# Patient Record
Sex: Male | Born: 2000 | Race: White | Hispanic: No | Marital: Single
Health system: Southern US, Community
[De-identification: ages and names within clinical notes are randomized; demographics above are authoritative.]

## PROBLEM LIST (undated history)

## (undated) DIAGNOSIS — Z889 Allergy status to unspecified drugs, medicaments and biological substances status: Secondary | ICD-10-CM

---
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
## 2020-03-06 IMAGING — US US SCROTUM W/ DOPPLER COMPLETE
1 series · 14 of 25 positions shown · non-contrast
Comparison: None.

CLINICAL DATA: One week of intermittent testicular discomfort with
occasional sharp pains. Symptoms are greatest on the left. No report
of injury.

EXAM:
SCROTAL ULTRASOUND
DOPPLER ULTRASOUND OF THE TESTICLES
TECHNIQUE: Complete ultrasound examination of the testicles, epididymis, and
other scrotal structures was performed. Color and spectral Doppler
ultrasound were also utilized to evaluate blood flow to the
testicles.

[Series 1: us scrotum w/ doppler complete · 0.07mm/px · 14 of 64 slices shown]
[im 1/64]
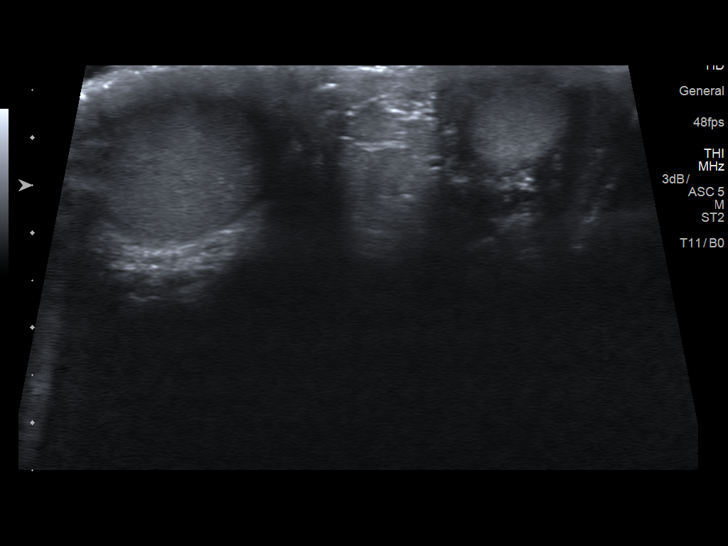
[im 6/64]
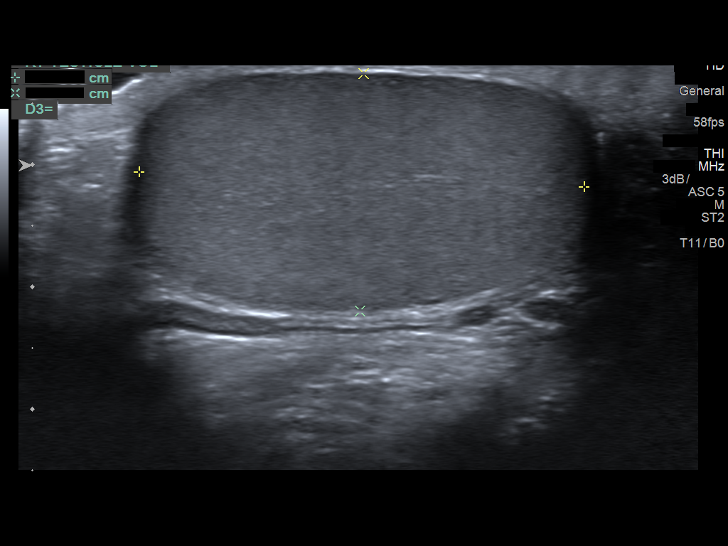
[im 11/64]
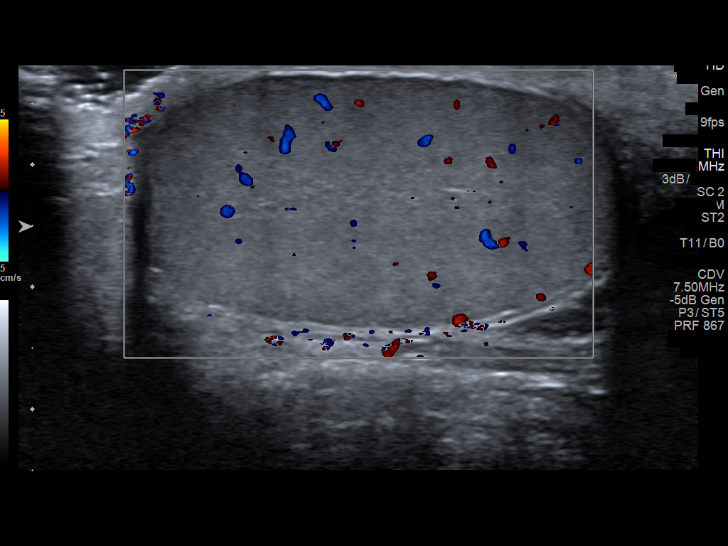
[im 16/64]
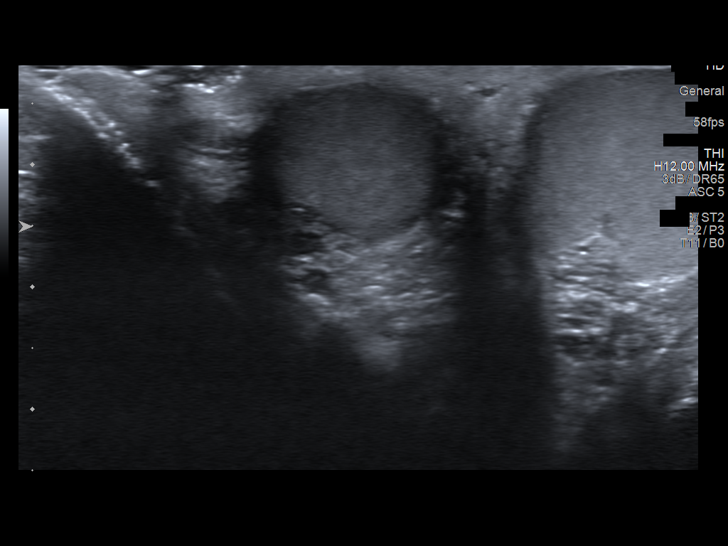
[im 22/64]
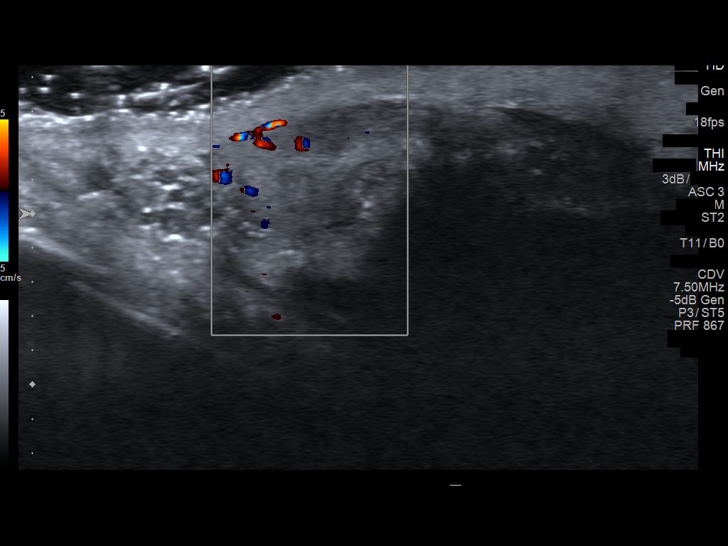
[im 24/64]
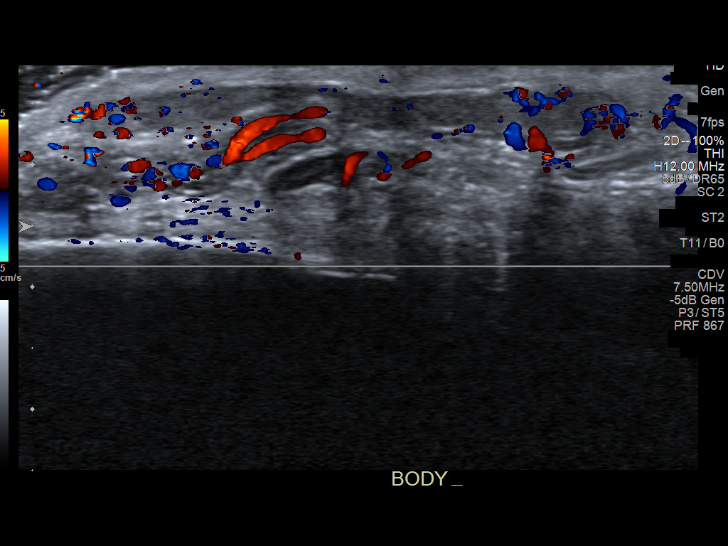
[im 29/64]
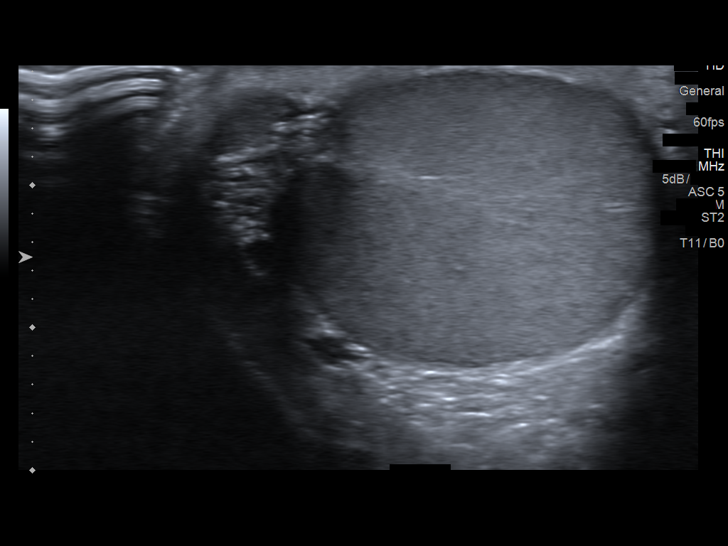
[im 35/64]
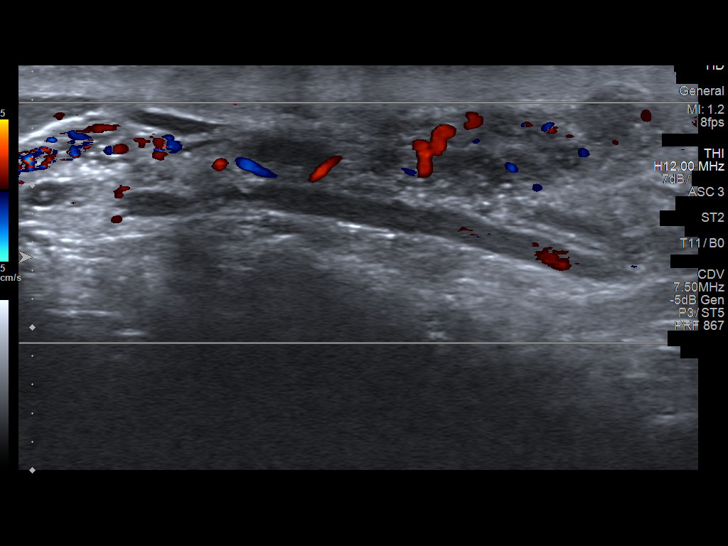
[im 40/64]
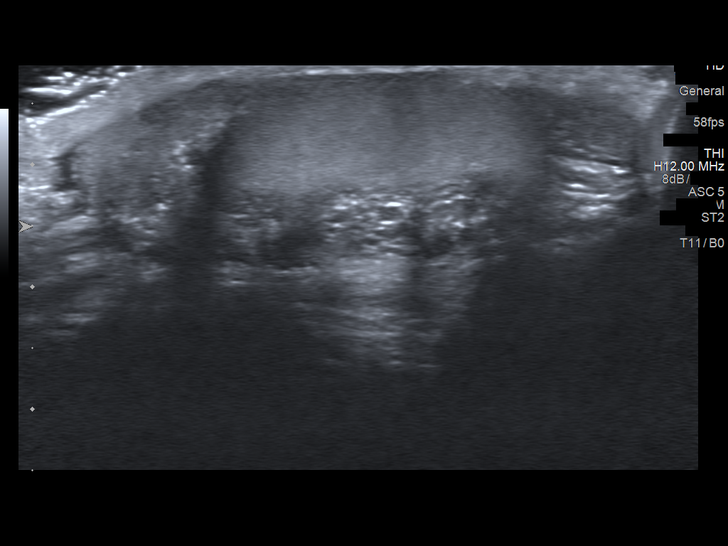
[im 43/64]
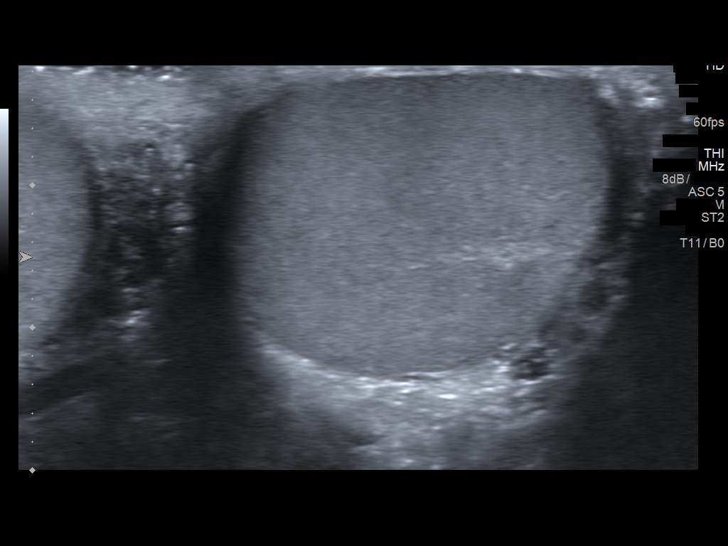
[im 48/64]
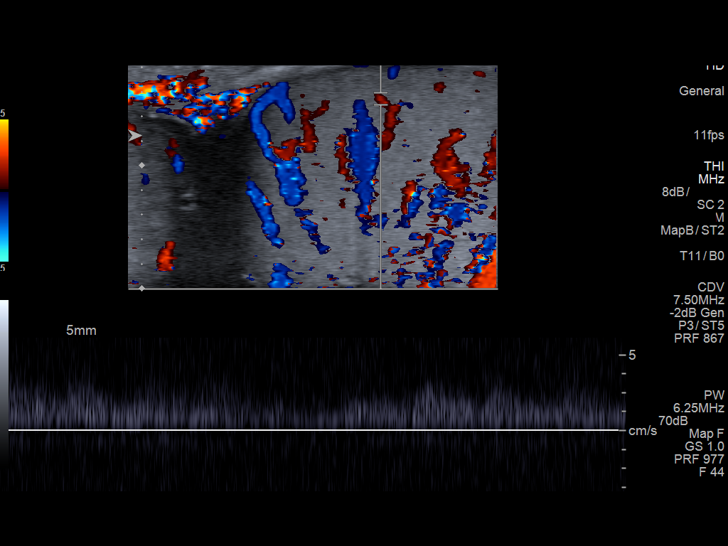
[im 53/64]
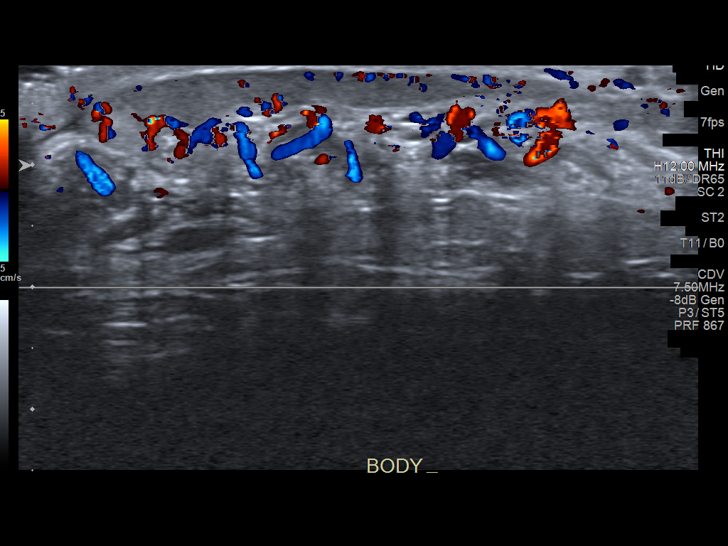
[im 58/64]
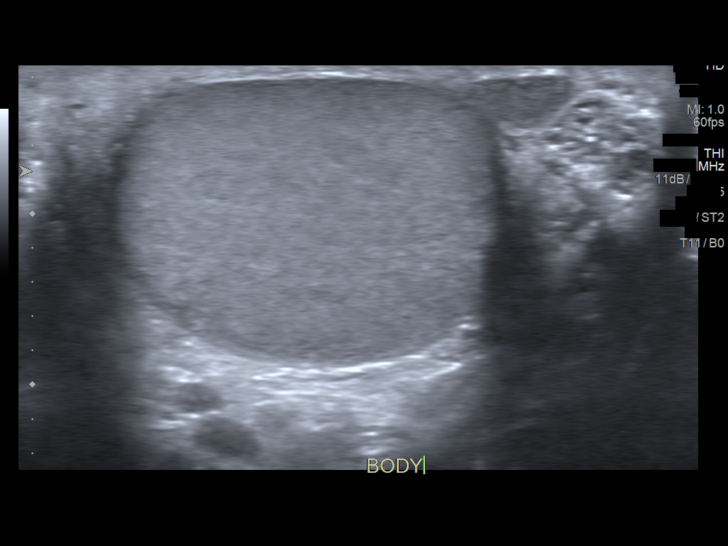
[im 64/64]
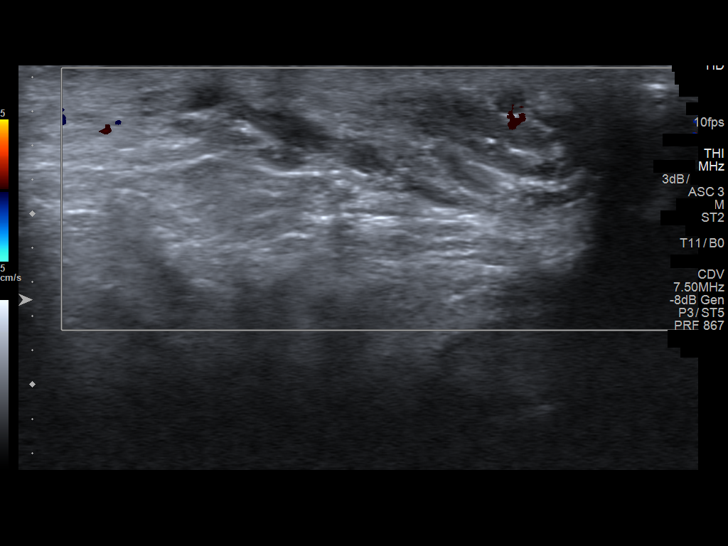

[14 of 25 positions shown; findings below may reference images not displayed]

FINDINGS: Right testicle

Measurements: 3.7 x 1.9 x 2.7 cm. No mass or microlithiasis
visualized.

Left testicle

Measurements: 4.3 x 1.9 x 2.6 cm. No mass or microlithiasis
visualized.

Right epididymis:  Normal in size and appearance.

Left epididymis:  Normal in size and appearance.

Hydrocele:  None visualized.

Varicocele:  None visualized.

Pulsed Doppler interrogation of both testes demonstrates normal low
resistance arterial and venous waveforms bilaterally.
IMPRESSION: Normal appearance of the testes and epididymal structures. No
evidence of epididymo-orchitis or torsion.

No abnormalities of the other scrotal contents.
